# Patient Record
Sex: Male | Born: 1970 | Race: Black or African American | Hispanic: No | Marital: Married | Smoking: Current every day smoker
Health system: Southern US, Community
[De-identification: ages and names within clinical notes are randomized; demographics above are authoritative.]

## PROBLEM LIST (undated history)

## (undated) DIAGNOSIS — J45909 Unspecified asthma, uncomplicated: Secondary | ICD-10-CM

---

## 1999-04-05 ENCOUNTER — Encounter: Payer: Self-pay | Admitting: Emergency Medicine

## 1999-04-05 ENCOUNTER — Emergency Department (HOSPITAL_COMMUNITY): Admission: EM | Admit: 1999-04-05 | Discharge: 1999-04-05 | Payer: Self-pay | Admitting: Emergency Medicine

## 2014-05-10 ENCOUNTER — Inpatient Hospital Stay (HOSPITAL_COMMUNITY)
Admission: EM | Admit: 2014-05-10 | Discharge: 2014-05-12 | DRG: 202 | Disposition: A | Discharge status: Home / Self Care | Payer: Managed Care, Other (non HMO) | Attending: Internal Medicine | Admitting: Internal Medicine

## 2014-05-10 ENCOUNTER — Encounter (HOSPITAL_COMMUNITY): Payer: Self-pay | Admitting: Emergency Medicine

## 2014-05-10 ENCOUNTER — Emergency Department (HOSPITAL_COMMUNITY): Payer: Managed Care, Other (non HMO)

## 2014-05-10 DIAGNOSIS — J209 Acute bronchitis, unspecified: Principal | ICD-10-CM

## 2014-05-10 DIAGNOSIS — F17200 Nicotine dependence, unspecified, uncomplicated: Secondary | ICD-10-CM | POA: Diagnosis present

## 2014-05-10 DIAGNOSIS — F1210 Cannabis abuse, uncomplicated: Secondary | ICD-10-CM | POA: Diagnosis present

## 2014-05-10 DIAGNOSIS — R55 Syncope and collapse: Secondary | ICD-10-CM

## 2014-05-10 DIAGNOSIS — Z8249 Family history of ischemic heart disease and other diseases of the circulatory system: Secondary | ICD-10-CM

## 2014-05-10 DIAGNOSIS — K219 Gastro-esophageal reflux disease without esophagitis: Secondary | ICD-10-CM

## 2014-05-10 DIAGNOSIS — J45901 Unspecified asthma with (acute) exacerbation: Secondary | ICD-10-CM

## 2014-05-10 DIAGNOSIS — Z91013 Allergy to seafood: Secondary | ICD-10-CM

## 2014-05-10 DIAGNOSIS — Z720 Tobacco use: Secondary | ICD-10-CM

## 2014-05-10 HISTORY — DX: Unspecified asthma, uncomplicated: J45.909

## 2014-05-10 LAB — TSH: TSH: 2.38 u[IU]/mL (ref 0.350–4.500)

## 2014-05-10 LAB — CBC WITH DIFFERENTIAL/PLATELET
BASOS PCT: 0 % (ref 0–1)
Basophils Absolute: 0 10*3/uL (ref 0.0–0.1)
EOS ABS: 0.3 10*3/uL (ref 0.0–0.7)
EOS PCT: 3 % (ref 0–5)
HCT: 42.9 % (ref 39.0–52.0)
Hemoglobin: 14.7 g/dL (ref 13.0–17.0)
Lymphocytes Relative: 17 % (ref 12–46)
Lymphs Abs: 1.8 10*3/uL (ref 0.7–4.0)
MCH: 27.2 pg (ref 26.0–34.0)
MCHC: 34.3 g/dL (ref 30.0–36.0)
MCV: 79.4 fL (ref 78.0–100.0)
MONOS PCT: 6 % (ref 3–12)
Monocytes Absolute: 0.6 10*3/uL (ref 0.1–1.0)
Neutro Abs: 7.8 10*3/uL — ABNORMAL HIGH (ref 1.7–7.7)
Neutrophils Relative %: 74 % (ref 43–77)
PLATELETS: 199 10*3/uL (ref 150–400)
RBC: 5.4 MIL/uL (ref 4.22–5.81)
RDW: 15.4 % (ref 11.5–15.5)
WBC: 10.5 10*3/uL (ref 4.0–10.5)

## 2014-05-10 LAB — BASIC METABOLIC PANEL
BUN: 14 mg/dL (ref 6–23)
CALCIUM: 9.3 mg/dL (ref 8.4–10.5)
CO2: 21 mEq/L (ref 19–32)
Chloride: 99 mEq/L (ref 96–112)
Creatinine, Ser: 1.05 mg/dL (ref 0.50–1.35)
GFR, EST NON AFRICAN AMERICAN: 87 mL/min — AB (ref >90)
Glucose, Bld: 96 mg/dL (ref 70–99)
POTASSIUM: 3.8 meq/L (ref 3.7–5.3)
Sodium: 137 mEq/L (ref 137–147)

## 2014-05-10 LAB — GLUCOSE, CAPILLARY: Glucose-Capillary: 140 mg/dL — ABNORMAL HIGH (ref 70–99)

## 2014-05-10 LAB — TROPONIN I
Troponin I: <0.3 ng/mL (ref <0.30)
Troponin I: <0.3 ng/mL (ref <0.30)

## 2014-05-10 LAB — MRSA PCR SCREENING: MRSA by PCR: NEGATIVE

## 2014-05-10 LAB — MAGNESIUM: MAGNESIUM: 2.2 mg/dL (ref 1.5–2.5)

## 2014-05-10 LAB — PRO B NATRIURETIC PEPTIDE: PRO B NATRI PEPTIDE: 7 pg/mL (ref 0–125)

## 2014-05-10 MED ORDER — METHYLPREDNISOLONE SODIUM SUCC 125 MG IJ SOLR
60.0000 mg | Freq: Four times a day (QID) | INTRAMUSCULAR | Status: DC
  Administered 2014-05-10: 60 mg via INTRAVENOUS
  Filled 2014-05-10: qty 2

## 2014-05-10 MED ORDER — ALBUTEROL SULFATE (2.5 MG/3ML) 0.083% IN NEBU
2.5000 mg | INHALATION_SOLUTION | RESPIRATORY_TRACT | Status: DC | PRN
  Administered 2014-05-10 – 2014-05-11 (×3): 2.5 mg via RESPIRATORY_TRACT
  Filled 2014-05-10 (×3): qty 3

## 2014-05-10 MED ORDER — HEPARIN SODIUM (PORCINE) 5000 UNIT/ML IJ SOLN
5000.0000 [IU] | Freq: Three times a day (TID) | INTRAMUSCULAR | Status: DC
  Administered 2014-05-10 – 2014-05-12 (×8): 5000 [IU] via SUBCUTANEOUS
  Filled 2014-05-10 (×8): qty 1

## 2014-05-10 MED ORDER — ONDANSETRON HCL 4 MG PO TABS: 4.0000 mg | ORAL_TABLET | Freq: Four times a day (QID) | ORAL | Status: DC | PRN

## 2014-05-10 MED ORDER — PANTOPRAZOLE SODIUM 40 MG PO TBEC
40.0000 mg | DELAYED_RELEASE_TABLET | Freq: Every day | ORAL | Status: DC
  Administered 2014-05-10 – 2014-05-11 (×2): 40 mg via ORAL
  Filled 2014-05-10 (×2): qty 1

## 2014-05-10 MED ORDER — IPRATROPIUM-ALBUTEROL 0.5-2.5 (3) MG/3ML IN SOLN
3.0000 mL | RESPIRATORY_TRACT | Status: DC
  Administered 2014-05-10 (×2): 3 mL via RESPIRATORY_TRACT
  Filled 2014-05-10: qty 3

## 2014-05-10 MED ORDER — NICOTINE 14 MG/24HR TD PT24
14.0000 mg | MEDICATED_PATCH | Freq: Every day | TRANSDERMAL | Status: DC
  Administered 2014-05-10 – 2014-05-12 (×3): 14 mg via TRANSDERMAL
  Filled 2014-05-10 (×3): qty 1

## 2014-05-10 MED ORDER — IPRATROPIUM-ALBUTEROL 0.5-2.5 (3) MG/3ML IN SOLN
3.0000 mL | Freq: Once | RESPIRATORY_TRACT | Status: DC
  Filled 2014-05-10: qty 3

## 2014-05-10 MED ORDER — ONDANSETRON HCL 4 MG/2ML IJ SOLN: 4.0000 mg | Freq: Four times a day (QID) | INTRAMUSCULAR | Status: DC | PRN

## 2014-05-10 MED ORDER — BUDESONIDE 0.25 MG/2ML IN SUSP
0.2500 mg | Freq: Two times a day (BID) | RESPIRATORY_TRACT | Status: DC
  Filled 2014-05-10 (×3): qty 2

## 2014-05-10 MED ORDER — ASPIRIN 81 MG PO CHEW
324.0000 mg | CHEWABLE_TABLET | Freq: Once | ORAL | Status: AC
Start: 2014-05-10 — End: 2014-05-10
  Administered 2014-05-10: 324 mg via ORAL
  Filled 2014-05-10: qty 4

## 2014-05-10 MED ORDER — ASPIRIN 81 MG PO CHEW
81.0000 mg | CHEWABLE_TABLET | Freq: Every day | ORAL | Status: DC
  Administered 2014-05-10 – 2014-05-12 (×3): 81 mg via ORAL
  Filled 2014-05-10 (×3): qty 1

## 2014-05-10 MED ORDER — ACETAMINOPHEN 650 MG RE SUPP: 650.0000 mg | Freq: Four times a day (QID) | RECTAL | Status: DC | PRN

## 2014-05-10 MED ORDER — SODIUM CHLORIDE 0.9 % IV SOLN
INTRAVENOUS | Status: DC
  Administered 2014-05-10: 06:00:00 via INTRAVENOUS

## 2014-05-10 MED ORDER — METHYLPREDNISOLONE SODIUM SUCC 125 MG IJ SOLR
125.0000 mg | Freq: Once | INTRAMUSCULAR | Status: AC
  Administered 2014-05-10: 125 mg via INTRAVENOUS
  Filled 2014-05-10: qty 2

## 2014-05-10 MED ORDER — IPRATROPIUM-ALBUTEROL 0.5-2.5 (3) MG/3ML IN SOLN
RESPIRATORY_TRACT | Status: AC
  Administered 2014-05-10: 3 mL
  Filled 2014-05-10: qty 3

## 2014-05-10 MED ORDER — GUAIFENESIN-DM 100-10 MG/5ML PO SYRP
5.0000 mL | ORAL_SOLUTION | ORAL | Status: DC | PRN
  Administered 2014-05-10 – 2014-05-12 (×4): 5 mL via ORAL
  Filled 2014-05-10 (×4): qty 5

## 2014-05-10 MED ORDER — MAGNESIUM SULFATE 40 MG/ML IJ SOLN
2.0000 g | Freq: Once | INTRAMUSCULAR | Status: AC
  Administered 2014-05-10: 2 g via INTRAVENOUS
  Filled 2014-05-10: qty 50

## 2014-05-10 MED ORDER — IPRATROPIUM-ALBUTEROL 0.5-2.5 (3) MG/3ML IN SOLN
3.0000 mL | Freq: Once | RESPIRATORY_TRACT | Status: AC
  Administered 2014-05-10: 3 mL via RESPIRATORY_TRACT
  Filled 2014-05-10: qty 3

## 2014-05-10 MED ORDER — ACETAMINOPHEN 325 MG PO TABS
650.0000 mg | ORAL_TABLET | Freq: Four times a day (QID) | ORAL | Status: DC | PRN
Start: 2014-05-10 — End: 2014-05-12

## 2014-05-10 MED ORDER — METHYLPREDNISOLONE SODIUM SUCC 125 MG IJ SOLR
60.0000 mg | Freq: Two times a day (BID) | INTRAMUSCULAR | Status: DC
  Administered 2014-05-10 – 2014-05-12 (×4): 60 mg via INTRAVENOUS
  Filled 2014-05-10 (×4): qty 2

## 2014-05-10 NOTE — Progress Notes (Signed)
  PROGRESS NOTE  Vincent Williamson XLK:[id number] DOB: [birth year] DOA: 05/10/2014 Vincent Williamson  Summary: 43 year old man with history of asthma, never intubated, presented to the emergency department with increasing shortness of breath for 4 days as well as an episode of syncope associated with coughing.  Assessment/Plan: 1. Acute bronchitis, possible asthma exacerbation. Treated at [facility] in March that are in albuterol. Characterized on last visit as mild persistent asthma, however subsequent telephone note suggests no asthma. 2. Syncope, cough induced. No arrhythmias. Troponins negative thus far. Favor vasovagal response. 3. Tobacco dependence. He is interested in quitting.   Continue bronchodilators, change to oral steroids 5/18, add doxycycline.  Followup 2-D echocardiogram, TSH. EKG shows normal sinus rhythm with early repolarization anterior leads.  Anticipate discharge one to 2 days.  Code Status: full code DVT prophylaxis: heparin Family Communication: none present Disposition Plan: home when improved  Brendia Sacksaniel Goodrich, MD  [facility]  Pager [phone] If 7PM-7AM, please contact night-coverage at [email or web address], password [facility] 05/10/2014, 7:25 AM  LOS: 0 days   Consultants:    Procedures:  Echocardiogram  Antibiotics:  Doxycycline 5/17 >>   HPI/Subjective: Overall feels better, breathing better but still has significant cough and wheezing. No chest pain. No other complaints. He reports that he developed syncope yesterday after coughing.  Objective: Filed Vitals:   05/10/14 0708 05/10/14 0709 05/10/14 0710 05/10/14 0711  BP: 143/96 158/103  130/89  Pulse: 85 74 86   Temp:      TempSrc:      Resp: 19 23 22    Height:      Weight:      SpO2: 90% 91% 93%     Intake/Output Summary (Last 24 hours) at 05/10/14 0725 Last data filed at 05/10/14 0604  Gross per 24 hour  Intake    230 ml  Output      0 ml  Net    230 ml     Filed Weights     05/10/14 0031 05/10/14 0425  Weight: 101.606 kg (224 lb) 100 kg (220 lb 7.4 oz)    Exam:   Afebrile, vital signs are stable, minimal tachypnea, no hypoxia.  Gen. Appears calm and comfortable. Speech is fluent and clear.  Psychiatric. Grossly normal mood and affect. Speech fluent and appropriate.  Cardiovascular. Regular rate and rhythm. No murmur, rub or gallop. No lower extremity edema.  Respiratory. Bilateral wheezing, no rhonchi or rales. Grossly normal respiratory effort. Speaks in full sentences.  Data Reviewed:  Basic metabolic panel, magnesium within normal limits.  Troponin normal on admission, BNP normal.  CBC normal.  Chest x-ray no acute disease.  EKG on admission normal sinus rhythm, T-wave inversion inferiorly. Early repolarization anterolateral leads.  Scheduled Meds: . budesonide (PULMICORT) nebulizer solution  0.25 mg Nebulization BID  . heparin  5,000 Units Subcutaneous 3 times per day  . ipratropium-albuterol  3 mL Nebulization Q4H  . methylPREDNISolone (SOLU-MEDROL) injection  60 mg Intravenous 4 times per day  . nicotine  14 mg Transdermal Daily  . pantoprazole  40 mg Oral Daily   Continuous Infusions: . sodium chloride 75 mL/hr at 05/10/14 0604    Principal Problem:   Asthma exacerbation Active Problems:   Tobacco abuse   Syncope   GERD (gastroesophageal reflux disease)   Time spent 25 minutes

## 2014-05-10 NOTE — ED Notes (Signed)
Pt also relates that he had a syncopal episode @ home. Got up from sitting at the table, took several steps and passed out. Family states it was a brief LOC. Pt does not remember. No injury secondary to fall.

## 2014-05-10 NOTE — ED Notes (Signed)
Pt states he started having diff with his breathing and cough over past several days.  Pt was given 5.0 albuterol neb en route with some improvement of symptoms

## 2014-05-10 NOTE — H&P (Addendum)
[facility] History and Physical  Vincent Williamson ZOX:[id number] DOB: [birth year] DOA: 05/10/2014  Referring physician: Dr. Preston FleetingGlick PCP: Joanne Charshristopher Zaguirre Rayala   Chief Complaint: Shortness of breath, syncope and increased wheezing.  HPI: Vincent Williamson is a 43 y.o. male with a past medical history significant for asthma (patient has never being intubated); GERD and tobacco abuse; came to the emergency department complaining of increased shortness of breath, worsening wheezing in brief episode of syncope. Patient reports that for the last 4 days or so prior to admission he has been experiencing increase shortness of breath and worsening wheezing. Patient denies any palpitations, chest pain, fever, chills, abdominal pain, nausea/vomiting, dysuria, diarrhea, melena, hematochezia or any other acute complaints. He reports that on the day of admission following a coughing spell episode he experienced syncope event with transient loss of consciousness. Patient reports to be compliant with his inhaler regimen at home, but endorses that this has not been effective lately. Patient is actively smoking about a quarter of a pack daily and has also is the use of marijuana intermittently (last use on the day of admission). In the ED patient with just mild improvement on his shortness of breath and wheezing despite multiple nebulizer treatments. Chest x-ray failed to demonstrate any acute infiltrates and blood work was essentially within normal limits.    Review of Systems:  Negative except as otherwise mentioned on history of present illness.  Past medical history: Asthma GERD Tobacco abuse  History reviewed. No pertinent past surgical history.  Social History:  reports that he has been smoking.  He does not have any smokeless tobacco history on file. He reports that he uses illicit drugs (Marijuana). He reports that he does not drink alcohol.  Allergies  Allergen Reactions  . Shellfish Allergy      Family history: Hypertension and history of coronary artery disease; otherwise noncontributory.  Prior to Admission medications   Medication Sig Start Date End Date Taking? Authorizing Provider  albuterol (PROVENTIL HFA;VENTOLIN HFA) 108 (90 BASE) MCG/ACT inhaler Inhale 1 puff into the lungs every 6 (six) hours as needed for wheezing or shortness of breath.   Yes Historical Provider, MD  fluticasone-salmeterol (ADVAIR HFA) 115-21 MCG/ACT inhaler Inhale 2 puffs into the lungs 2 (two) times daily.   Yes Historical Provider, MD   Physical Exam: Filed Vitals:   05/10/14 0425  BP:   Pulse:   Temp: 97.9 F (36.6 C)  Resp:     BP 98/61  Pulse 75  Temp(Src) 97.9 F (36.6 C) (Oral)  Resp 25  Ht 5\' 11"  (1.803 m)  Wt 100 kg (220 lb 7.4 oz)  BMI 30.76 kg/m2  SpO2 92%  General:  Appears calm and comfortable; mild difficulty speaking in full sentences; no use of accessory muscles. Afebrile. Cough productive to examination, able to follow commands and answer questions properly. Eyes: PERRL, normal lids, irises & conjunctiva; no icterus ENT: grossly normal hearing, lips & tongue; moist mucous membranes, no erythema or exudate inside his mouth, no drainage out of his ears or nostrils. Neck: no LAD, masses or thyromegaly; no JVD Cardiovascular: RRR, no m/r/g. No LE edema. Telemetry: SR, no arrhythmias  Respiratory: Diffuse expiratory wheeze, scattered rhonchi; no crackles or rales Abdomen: soft, nontender, nondistended, positive bowel sounds Skin: no rash, petechiae, open wounds or induration seen on exam Musculoskeletal and extremities: grossly normal tone BUE/BLE, full range of motion, trace of edema bilaterally. Psychiatric: grossly normal mood and affect, speech fluent and appropriate Neurologic: grossly non-focal.  Labs on Admission:  Basic Metabolic Panel:  Recent Labs Lab 05/10/14 0110  NA 137  K 3.8  CL 99  CO2 21  GLUCOSE 96  BUN 14  CREATININE 1.05  CALCIUM  9.3   CBC:  Recent Labs Lab 05/10/14 0110  WBC 10.5  NEUTROABS 7.8*  HGB 14.7  HCT 42.9  MCV 79.4  PLT 199   Cardiac Enzymes:  Recent Labs Lab 05/10/14 0110  TROPONINI <0.30   Radiological Exams on Admission: Dg Chest Port 1 View  05/10/2014   CLINICAL DATA:  Shortness of breath, syncope.  EXAM: PORTABLE CHEST - 1 VIEW  COMPARISON:  None.  FINDINGS: The heart size and mediastinal contours are within normal limits. Both lungs are clear. The visualized skeletal structures are unremarkable. Multiple EKG lines overlie the patient and may obscure subtle underlying pathology.  IMPRESSION: No active disease.   Electronically Signed   By: Awilda Metroourtnay  Bloomer   On: 05/10/2014 01:12    EKG:  Sinus rhythm, normal axis; early repolarization appreciated on anterior leads. No frank acute ischemic changes appreciated.  Assessment/Plan 1-acute respiratory distress and shortness of breath: Secondary to Asthma exacerbation. -Patient will be admitted to telemetry bed -Will start treatment with Solu-Medrol, Pulmicort, schedule and as needed nebulizer treatment (using albuterol and Atrovent) -Will also start her flutter valve and follow patient response assessing peak flows -Will provide supportive care and given ongoing wheezing despite multiple nebulizer treatment will give 1 dose of magnesium  -will check BNP  -PRN antitussives. -Will check orthostatic vital signs. -Patient advised to quit smoking.  2-Tobacco abuse and use of marijuana: Comes and cessation has been provided. Patient was. Receptive and is contemplating to quit. -Nicotine patch has been provided during this admission   3-Syncope: According to patient episode occurred during a coughing spell leading to urology to be secondary to vasovagal stimulation. -Will check TSH, patient will be admitted to telemetry and will check 2-D echo. -since patient is complaining of SOB on exertion (most likely due to asthma), but has family hx of  CAD and trace edema bilaterally; will check BNP and cycle CE's.  4-GERD (gastroesophageal reflux disease): will use protonix 40mg  daily  DVT: heparin   Code Status: Full Family Communication: no family at bedside Disposition Plan: Length of stay more than 2 midnights, inpatient status; telemetry bed.  Time spent: 50 minutes  Vassie Lollarlos Juanell Saffo [facility] Pager [phone]

## 2014-05-10 NOTE — Progress Notes (Signed)
Echocardiogram 2D Echocardiogram has been performed.  Estelle GrumblesMelissa J Levester Waldridge 05/10/2014, 1:09 PM

## 2014-05-10 NOTE — ED Provider Notes (Signed)
CSN: [id number]     Arrival date & time 05/10/14  0023 History   First MD Initiated Contact with Patient 05/10/14 0038     Chief Complaint  Patient presents with  . Wheezing     (Consider location/radiation/quality/duration/timing/severity/associated sxs/prior Treatment) Patient is a 43 y.o. male presenting with wheezing. The history is provided by the patient.  Wheezing He has had cough with difficulty breathing for the last 3 days. Cough is productive of clear sputum. He has noted some wheezing and has used albuterol with incomplete relief. He has noted some episodes of sweating. Today, dyspnea was worse. He has noted that dyspnea is worse with exertion. He has noted more swelling today but denies fever or chills. He has noted a tight feeling in his chest. He had one occasion where he stood up and walked a short distance and passed out without any warning. He denies any palpitations or lightheadedness before syncope. Loss of consciousness was brief. There is no associated nausea or vomiting. He is a cigarette smoker admitting to one quarter pack of cigarettes a day. He had episodes of bronchitis recently and states that his inhaler was more effective for the difficulty breathing with his bronchitis plan with this current episode. He came in by ambulance to give him a nebulizer treatment with partial relief of symptoms.  History reviewed. No pertinent past medical history. History reviewed. No pertinent past surgical history. No family history on file. History  Substance Use Topics  . Smoking status: Current Every Day Smoker  . Smokeless tobacco: Not on file  . Alcohol Use: No    Review of Systems  Respiratory: Positive for wheezing.   All other systems reviewed and are negative.     Allergies  Shellfish allergy  Home Medications   Prior to Admission medications   Medication Sig Start Date End Date Taking? Authorizing Provider  albuterol (PROVENTIL HFA;VENTOLIN HFA) 108 (90  BASE) MCG/ACT inhaler Inhale 1 puff into the lungs every 6 (six) hours as needed for wheezing or shortness of breath.   Yes Historical Provider, MD  fluticasone-salmeterol (ADVAIR HFA) 115-21 MCG/ACT inhaler Inhale 2 puffs into the lungs 2 (two) times daily.   Yes Historical Provider, MD   BP 136/91  Pulse 93  Temp(Src) 98.7 F (37.1 C) (Oral)  Resp 22  Ht 5\' 9"  (1.753 m)  Wt 224 lb (101.606 kg)  BMI 33.06 kg/m2  SpO2 94% Physical Exam  Nursing note and vitals reviewed.  43 year old male, resting comfortably and in no acute distress. Vital signs are significant for borderline hypertension with blood pressure 136/91, and tachypnea with respiratory rate of 22. Oxygen saturation is 94%, which is normal. Head is normocephalic and atraumatic. PERRLA, EOMI. Oropharynx is clear. Neck is nontender and supple without adenopathy or JVD. Back is nontender and there is no CVA tenderness. Lungs have mild expiratory wheezes without rales or rhonchi. Chest is nontender. Heart has regular rate and rhythm without murmur. Abdomen is soft, flat, nontender without masses or hepatosplenomegaly and peristalsis is normoactive. Extremities have no cyanosis or edema, full range of motion is present. Skin is warm and dry without rash. Neurologic: Mental status is normal, cranial nerves are intact, there are no motor or sensory deficits.  ED Course  Procedures (including critical care time) Labs Review Labs Reviewed - No data to display  Imaging Review Dg Chest Port 1 View  05/10/2014   CLINICAL DATA:  Shortness of breath, syncope.  EXAM: PORTABLE CHEST - 1 VIEW  COMPARISON:  None.  FINDINGS: The heart size and mediastinal contours are within normal limits. Both lungs are clear. The visualized skeletal structures are unremarkable. Multiple EKG lines overlie the patient and may obscure subtle underlying pathology.  IMPRESSION: No active disease.   Electronically Signed   By: Awilda Metroourtnay  Bloomer   On: 05/10/2014  01:12     EKG Interpretation   Date/Time:  Sunday May 10 2014 01:00:33 EDT Ventricular Rate:  91 PR Interval:  154 QRS Duration: 88 QT Interval:  354 QTC Calculation: 435 R Axis:   66 Text Interpretation:  Sinus rhythm Borderline T wave abnormalities  Borderline ST elevation, anterior leads No old tracing to compare  Confirmed by [facility]  MD, Trew Sunde ([id number]) on 05/10/2014 1:57:03 AM       EKG Interpretation   Date/Time:  Sunday May 10 2014 02:27:55 EDT Ventricular Rate:  81 PR Interval:  157 QRS Duration: 88 QT Interval:  379 QTC Calculation: 440 R Axis:   54 Text Interpretation:  Sinus rhythm RSR' in V1 or V2, probably normal  variant Borderline ST elevation, anterior leads No significant change  since last tracing Confirmed by [facility]  MD, Akayla Brass ([id number]) on 05/10/2014  3:37:09 AM       MDM   Final diagnoses:  Asthma exacerbation    Cough with wheezing consistent with bronchitis and asthma. Syncopal episode uncertain cause. He will need cardiac evaluation in light of syncope. In the meantime, he is given additional nebulizer treatment with albuterol and ipratropium and also given a dose of methylprednisolone. Because of concern about possible cardiac issues, he is given a dose of oral aspirin. Old records are reviewed and [facility] and was started on Advair as well as given an albuterol inhaler.  ECG showed questionable ST elevation in the anterolateral leads. Initial troponin is negative. Repeat ECG shows no change. He showed no additional improvement in wheezing following albuterol with ipratropium. This was repeated with still no improvement. At this point, it was decided to admit him because of failure to clear with 3 nebulizer treatments. Case is discussed with Dr. Gwenlyn PerkingMadera of [facility] who agrees to admit the patient.    Dione Boozeavid Claudette Wermuth, MD 05/10/14 [phone]

## 2014-05-11 LAB — GLUCOSE, CAPILLARY: Glucose-Capillary: 122 mg/dL — ABNORMAL HIGH (ref 70–99)

## 2014-05-11 MED ORDER — DOXYCYCLINE HYCLATE 100 MG PO TABS: 100.0000 mg | ORAL_TABLET | Freq: Two times a day (BID) | ORAL | Status: DC

## 2014-05-11 MED ORDER — ALBUTEROL SULFATE (2.5 MG/3ML) 0.083% IN NEBU
2.5000 mg | INHALATION_SOLUTION | Freq: Four times a day (QID) | RESPIRATORY_TRACT | Status: DC
  Administered 2014-05-11 – 2014-05-12 (×7): 2.5 mg via RESPIRATORY_TRACT
  Filled 2014-05-11 (×7): qty 3

## 2014-05-11 MED ORDER — AZITHROMYCIN 250 MG PO TABS: 500.0000 mg | ORAL_TABLET | Freq: Every day | ORAL | Status: DC

## 2014-05-11 MED ORDER — ALBUTEROL SULFATE (2.5 MG/3ML) 0.083% IN NEBU
2.5000 mg | INHALATION_SOLUTION | RESPIRATORY_TRACT | Status: DC | PRN
  Administered 2014-05-12: 2.5 mg via RESPIRATORY_TRACT
  Filled 2014-05-11: qty 3

## 2014-05-11 MED ORDER — PANTOPRAZOLE SODIUM 40 MG PO TBEC
40.0000 mg | DELAYED_RELEASE_TABLET | Freq: Two times a day (BID) | ORAL | Status: DC
  Administered 2014-05-11 – 2014-05-12 (×2): 40 mg via ORAL
  Filled 2014-05-11 (×2): qty 1

## 2014-05-11 MED ORDER — DM-GUAIFENESIN ER 30-600 MG PO TB12
2.0000 | ORAL_TABLET | Freq: Two times a day (BID) | ORAL | Status: DC
  Administered 2014-05-11 – 2014-05-12 (×3): 2 via ORAL
  Filled 2014-05-11: qty 2
  Filled 2014-05-11: qty 1
  Filled 2014-05-11: qty 2
  Filled 2014-05-11: qty 1

## 2014-05-11 MED ORDER — DOXYCYCLINE HYCLATE 100 MG IV SOLR: 100.0000 mg | Freq: Two times a day (BID) | INTRAVENOUS | Status: DC

## 2014-05-11 MED ORDER — DOXYCYCLINE HYCLATE 100 MG PO TABS
100.0000 mg | ORAL_TABLET | Freq: Two times a day (BID) | ORAL | Status: DC
  Administered 2014-05-11 – 2014-05-12 (×3): 100 mg via ORAL
  Filled 2014-05-11 (×3): qty 1

## 2014-05-11 NOTE — Progress Notes (Signed)
Report called to 300.  Patient being transferred to RM 331 via W/C by NT in stable condition A&OX4.

## 2014-05-11 NOTE — Progress Notes (Signed)
PROGRESS NOTE  Vincent Williamson RUE:[id number] DOB: [birth year] DOA: 05/10/2014 PCP: Vincent Williamson  Summary: 43 year old man with history of asthma, never intubated, presented to the emergency department with increasing shortness of breath for 4 days as well as an episode of syncope associated with coughing.  Assessment/Plan: 1. Acute bronchitis, Bronchospasm. [facility] that are in albuterol. Characterized on last visit as mild persistent asthma, however subsequent telephone note suggests no asthma>> also patient states [place] "breathing test" and  he was told that he does not have Asthma -Will start antibiotic, continue current  Nebulized bronchodilators -Continue Solu-Medrol for today as he still wheezing diffusely -Will add mucolytics and follow 2.  Syncope, cough induced. No arrhythmias. Troponins negative thus far. Favor vasovagal response. -2-D echo pending, follow 3. Tobacco dependence. He is interested in quitting, encouraged to quit.   Code Status: full code DVT prophylaxis: heparin Family Communication: none present Disposition Plan: home when medically ready  Consultants:  none  Procedures:  Echocardiogram  Antibiotics:  Doxycycline 5/18 >>   HPI/Subjective:  still with significant cough and wheezing, this short of breath with coughing.No chest pain. Denies dizziness Objective: Filed Vitals:   05/11/14 0024 05/11/14 0429 05/11/14 0602 05/11/14 1045  BP:  147/84    Pulse:  77    Temp:  97.6 F (36.4 C)    TempSrc:  Oral    Resp:  20    Height:      Weight:      SpO2: 94% 95% 99% 96%    Intake/Output Summary (Last 24 hours) at 05/11/14 1258 Last data filed at 05/11/14 1246  Gross per 24 hour  Intake    660 ml  Output      0 ml  Net    660 ml     Filed Weights   05/10/14 0031 05/10/14 0425  Weight: 101.606 kg (224 lb) 100 kg (220 lb 7.4 oz)    Exam:   Afebrile, vital signs are stable,  minimal tachypnea, no hypoxia.  Gen. Appears calm and comfortable. Speech is fluent and clear.  Psychiatric. Grossly normal mood and affect. Speech fluent and appropriate.  Cardiovascular. Regular rate and rhythm. No murmur, rub or gallop. No lower extremity edema.  Respiratory. diffuse wheezing  bil, no rhonchi or rales. Grossly normal respiratory effort. Speaks in full sentences.   extremities: No cyanosis and no edema  Data Reviewed:  Basic metabolic panel, magnesium within normal limits.  Troponin normal on admission, BNP normal.  CBC normal.  Chest x-ray no acute disease.  EKG on admission normal sinus rhythm, T-wave inversion inferiorly. Early repolarization anterolateral leads.  Scheduled Meds: . albuterol  2.5 mg Nebulization QID  . aspirin  81 mg Oral Daily  . azithromycin  500 mg Oral Daily  . dextromethorphan-guaiFENesin  2 tablet Oral BID  . heparin  5,000 Units Subcutaneous 3 times per day  . methylPREDNISolone (SOLU-MEDROL) injection  60 mg Intravenous Q12H  . nicotine  14 mg Transdermal Daily  . pantoprazole  40 mg Oral BID AC   Continuous Infusions:    Principal Problem:   Acute bronchitis Active Problems:   Asthma exacerbation   Tobacco abuse   Syncope   GERD (gastroesophageal reflux disease)   Time spent 25 minutes   Donnalee CurryAdeline Travas Schexnayder, MD  [facility]  Pager [phone] If 7PM-7AM, please contact night-coverage at [email or web address], password [email or web address] 05/11/2014, 12:58 PM  LOS: 1 day

## 2014-05-11 NOTE — Progress Notes (Signed)
Utilization Review Complete  

## 2014-05-11 NOTE — Progress Notes (Signed)
Breathing tx helpful for pt to get some sleep. Pt had continued audible wheezing with occasional cough during periods of rest. Pt running SR on tele with periods of ST when coughing. No others complaints. Pt up ad lib with callbell in reach

## 2014-05-11 NOTE — Progress Notes (Signed)
Pt having a coughing attack. SOB and audible wheezing, sats are in mid 80's. Pt sweating. Placed pt on 02 @ 2L. Gave cool clothes and called R/T for prn breathing tx. Will monitor closely

## 2014-05-12 LAB — GLUCOSE, CAPILLARY: Glucose-Capillary: 108 mg/dL — ABNORMAL HIGH (ref 70–99)

## 2014-05-12 MED ORDER — PREDNISONE 20 MG PO TABS: 40.0000 mg | ORAL_TABLET | Freq: Every day | ORAL | Status: AC

## 2014-05-12 MED ORDER — DM-GUAIFENESIN ER 30-600 MG PO TB12: 2.0000 | ORAL_TABLET | Freq: Two times a day (BID) | ORAL | Status: AC

## 2014-05-12 MED ORDER — OMEPRAZOLE 40 MG PO CPDR: 40.0000 mg | DELAYED_RELEASE_CAPSULE | Freq: Every day | ORAL | Status: AC

## 2014-05-12 MED ORDER — DOXYCYCLINE HYCLATE 100 MG PO TABS: 100.0000 mg | ORAL_TABLET | Freq: Two times a day (BID) | ORAL | Status: AC

## 2014-05-12 NOTE — Progress Notes (Signed)
Patient with orders to be discharge home. Discharge instructions given, patient verbalized understanding. Prescriptions given. Patient stable. Patient left in private vehicle with family.  

## 2014-05-12 NOTE — Discharge Summary (Signed)
Physician Discharge Summary  Vincent GearCalvin Hoeppner WUJ:[id number] DOB: September 17, 1971 DOA: 05/10/2014  PCP: Minette BrineAYALA, CHRISTOPHER ZAGUIRRE, MD  Admit date: 05/10/2014 Discharge date: 05/12/2014  Time spent: >30 minutes  Recommendations for Outpatient Follow-up:  Follow-up Information   Follow up with RAYALA, Joanne CharsHRISTOPHER ZAGUIRRE, MD. (In one week, call for appointment upon discharge)    Specialty:  Family Medicine   Contact information:   [address] [place] [id number] [phone]        Discharge Diagnoses:  Principal Problem:   Acute bronchitis Active Problems:   Asthma exacerbation   Tobacco abuse   Syncope   GERD (gastroesophageal reflux disease)   Discharge Condition: improved/stable  Diet recommendation: Regular  Filed Weights   05/10/14 0031 05/10/14 0425  Weight: 101.606 kg (224 lb) 100 kg (220 lb 7.4 oz)    History of present illness:  Vincent Williamson is a 43 y.o. male with a past medical history significant for asthma (patient has never being intubated); GERD and tobacco abuse; came to the emergency department complaining of increased shortness of breath, worsening wheezing in brief episode of syncope. Patient reports that for the last 4 days or so prior to admission he has been experiencing increase shortness of breath and worsening wheezing. Patient denies any palpitations, chest pain, fever, chills, abdominal pain, nausea/vomiting, dysuria, diarrhea, melena, hematochezia or any other acute complaints. He reports that on the day of admission following a coughing spell episode he experienced syncope event with transient loss of consciousness. Patient reports to be compliant with his inhaler regimen at home, but endorses that this has not been effective lately. Patient is actively smoking about a quarter of a pack daily and has also is the use of marijuana intermittently (last use on the day of admission).  In the ED patient with just mild improvement on his shortness  of breath and wheezing despite multiple nebulizer treatments. Chest x-ray failed to demonstrate any acute infiltrates and blood work was essentially within normal limits. He was admitted for further evaluation and management.    Hospital Course:  1. Acute bronchitis, Bronchospasm. As discussed above, it was noted It on patient's last visit he was Characterized as having mild persistent asthma, however subsequent telephone note from [facility] suggests no asthma>> also patient states that the pulmonologist in [place] did a "breathing test" and he was told that he does not have Asthma -It is noted that he is a smoker. on admission he was placed on Solu-Medrol and nebulized bronchodilators .On followup he was started on antibiotic and mucolytics-he is clinically much improved on followup today -He is medically ready for discharge-will DC on prednisone, bronchodilators, antibiotics and his to followup with his PCP 2. Syncope,  No arrhythmias. Troponins negative thus far. Favor vasovagal response, also possibly cough induced. -2-D echo was done and came back with EF of 60-65% and no wall motion abnormalities 3.Tobacco dependence. He is interested in quitting, encouraged to quit. He is to follow up with PCP  Procedures:  Echo Study Conclusions  - Left ventricle: The cavity size was normal. Systolic function was normal. The estimated ejection fraction was in the range of 60% to 65%. Wall motion was normal; there were no regional wall motion abnormalities. Left ventricular diastolic function parameters were normal. Mild concentric and moderate, focal basal septal hypertrophy.  Consultations:  None  Discharge Exam: Filed Vitals:   05/11/14 2233  BP: 139/85  Pulse: 61  Temp: 97.9 F (36.6 C)  Resp: 20    Exam:  Afebrile,  in NAD Gen. Appears calm and comfortable. Speech is fluent and clear.  Psychiatric. Grossly normal mood and affect. Speech fluent and appropriate.  Cardiovascular. Regular  rate and rhythm. No murmur, rub or gallop. No lower extremity edema.  Respiratory. d decreased wheezes, no crackles Grossly normal respiratory effort. Speaks in full sentences.  extremities: No cyanosis and no edema   Discharge Instructions You were cared for by a hospitalist during your hospital stay. If you have any questions about your discharge medications or the care you received while you were in the hospital after you are discharged, you can call the unit and asked to speak with the hospitalist on call if the hospitalist that took care of you is not available. Once you are discharged, your primary care physician will handle any further medical issues. Please note that NO REFILLS for any discharge medications will be authorized once you are discharged, as it is imperative that you return to your primary care physician (or establish a relationship with a primary care physician if you do not have one) for your aftercare needs so that they can reassess your need for medications and monitor your lab values.     Medication List         albuterol 108 (90 BASE) MCG/ACT inhaler  Commonly known as:  PROVENTIL HFA;VENTOLIN HFA  Inhale 1 puff into the lungs every 6 (six) hours as needed for wheezing or shortness of breath.     dextromethorphan-guaiFENesin 30-600 MG per 12 hr tablet  Commonly known as:  MUCINEX DM  Take 2 tablets by mouth 2 (two) times daily.     doxycycline 100 MG tablet  Commonly known as:  VIBRA-TABS  Take 1 tablet (100 mg total) by mouth every 12 (twelve) hours.     fluticasone-salmeterol 115-21 MCG/ACT inhaler  Commonly known as:  ADVAIR HFA  Inhale 2 puffs into the lungs 2 (two) times daily.     guaifenesin 100 MG/5ML syrup  Commonly known as:  ROBITUSSIN  Take 200 mg by mouth 3 (three) times daily as needed for cough.     loratadine 10 MG tablet  Commonly known as:  CLARITIN  Take 10 mg by mouth daily as needed for allergies.     omeprazole 40 MG capsule   Commonly known as:  PRILOSEC  Take 1 capsule (40 mg total) by mouth daily.     predniSONE 20 MG tablet  Commonly known as:  DELTASONE  Take 2 tablets (40 mg total) by mouth daily with breakfast.       Allergies  Allergen Reactions  . Shellfish Allergy Anaphylaxis       Follow-up Information   Follow up with RAYALA, Joanne Chars, MD. (In one week, call for appointment upon discharge)    Specialty:  Family Medicine   Contact information:   [address] [place] [address] [phone]        The results of significant diagnostics from this hospitalization (including imaging, microbiology, ancillary and laboratory) are listed below for reference.    Significant Diagnostic Studies: Dg Chest Port 1 View  05/10/2014   CLINICAL DATA:  Shortness of breath, syncope.  EXAM: PORTABLE CHEST - 1 VIEW  COMPARISON:  None.  FINDINGS: The heart size and mediastinal contours are within normal limits. Both lungs are clear. The visualized skeletal structures are unremarkable. Multiple EKG lines overlie the patient and may obscure subtle underlying pathology.  IMPRESSION: No active disease.   Electronically Signed   By: Pernell Dupre  Bloomer   On: 05/10/2014 01:12    Microbiology: Recent Results (from the past 240 hour(s))  MRSA PCR SCREENING     Status: None   Collection Time    05/10/14  4:15 AM      Result Value Ref Range Status   MRSA by PCR NEGATIVE  NEGATIVE Final   Comment:            The GeneXpert MRSA Assay (FDA     approved for NASAL specimens     only), is one component of a     comprehensive MRSA colonization     surveillance program. It is not     intended to diagnose MRSA     infection nor to guide or     monitor treatment for     MRSA infections.     Labs: Basic Metabolic Panel:  Recent Labs Lab 05/10/14 0110  NA 137  K 3.8  CL 99  CO2 21  GLUCOSE 96  BUN 14  CREATININE 1.05  CALCIUM 9.3  MG 2.2   Liver Function Tests: No results found  for this basename: AST, ALT, ALKPHOS, BILITOT, PROT, ALBUMIN,  in the last 168 hours No results found for this basename: LIPASE, AMYLASE,  in the last 168 hours No results found for this basename: AMMONIA,  in the last 168 hours CBC:  Recent Labs Lab 05/10/14 0110  WBC 10.5  NEUTROABS 7.8*  HGB 14.7  HCT 42.9  MCV 79.4  PLT 199   Cardiac Enzymes:  Recent Labs Lab 05/10/14 0110 05/10/14 0714 05/10/14 1330  TROPONINI <0.30 <0.30 <0.30   BNP: BNP (last 3 results)  Recent Labs  05/10/14 0110  PROBNP 7.0   CBG:  Recent Labs Lab 05/10/14 0719 05/11/14 0719 05/12/14 0748  GLUCAP 140* 122* 108*       Signed:  Christiane Sistare C Odes Lolli  [facility] 05/12/2014, 2:49 PM

## 2015-06-23 IMAGING — CR DG CHEST 1V PORT
1 series · 1 of 1 positions shown · non-contrast
Comparison: None.

CLINICAL DATA: Shortness of breath, syncope.

EXAM:
PORTABLE CHEST - 1 VIEW

[portable]
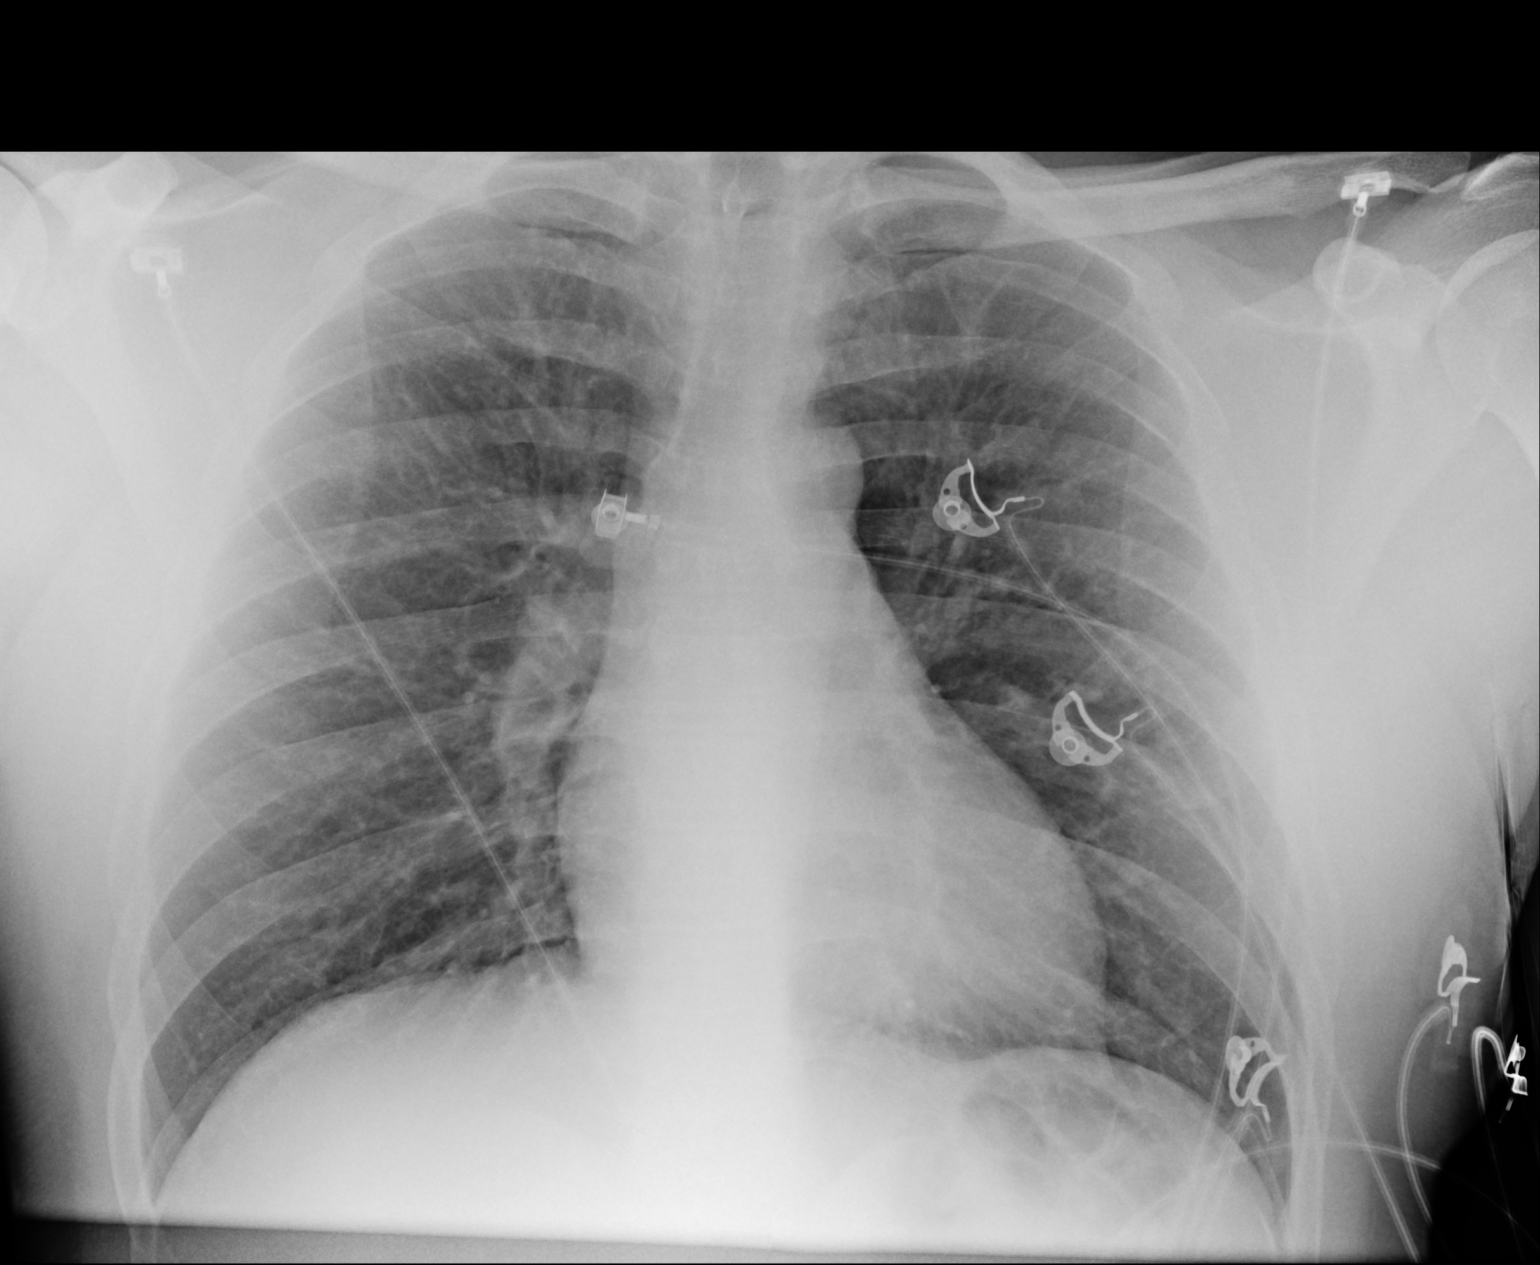

[1 of 1 positions shown; findings below may reference images not displayed]

FINDINGS: The heart size and mediastinal contours are within normal limits.
Both lungs are clear. The visualized skeletal structures are
unremarkable. Multiple EKG lines overlie the patient and may obscure
subtle underlying pathology.
IMPRESSION: No active disease.

  By: Dox Mahn Notok
